# Patient Record
Sex: Male | Born: 1965 | Race: White | Hispanic: No | Marital: Single | Smoking: Former smoker
Health system: Southern US, Community
[De-identification: ages and names within clinical notes are randomized; demographics above are authoritative.]

---

## 2003-11-02 ENCOUNTER — Emergency Department (HOSPITAL_COMMUNITY): Admission: EM | Admit: 2003-11-02 | Discharge: 2003-11-02 | Payer: Self-pay | Admitting: Family Medicine

## 2015-08-26 ENCOUNTER — Emergency Department (HOSPITAL_BASED_OUTPATIENT_CLINIC_OR_DEPARTMENT_OTHER): Payer: Commercial Managed Care - HMO

## 2015-08-26 ENCOUNTER — Emergency Department (HOSPITAL_BASED_OUTPATIENT_CLINIC_OR_DEPARTMENT_OTHER)
Admission: EM | Admit: 2015-08-26 | Discharge: 2015-08-26 | Disposition: A | Discharge status: Home / Self Care | Payer: Commercial Managed Care - HMO | Attending: Emergency Medicine | Admitting: Emergency Medicine

## 2015-08-26 ENCOUNTER — Encounter (HOSPITAL_BASED_OUTPATIENT_CLINIC_OR_DEPARTMENT_OTHER): Payer: Self-pay | Admitting: *Deleted

## 2015-08-26 DIAGNOSIS — W270XXA Contact with workbench tool, initial encounter: Secondary | ICD-10-CM | POA: Insufficient documentation

## 2015-08-26 DIAGNOSIS — Y9389 Activity, other specified: Secondary | ICD-10-CM | POA: Diagnosis not present

## 2015-08-26 DIAGNOSIS — F17200 Nicotine dependence, unspecified, uncomplicated: Secondary | ICD-10-CM | POA: Insufficient documentation

## 2015-08-26 DIAGNOSIS — S62639B Displaced fracture of distal phalanx of unspecified finger, initial encounter for open fracture: Secondary | ICD-10-CM

## 2015-08-26 DIAGNOSIS — S4992XA Unspecified injury of left shoulder and upper arm, initial encounter: Secondary | ICD-10-CM | POA: Diagnosis present

## 2015-08-26 DIAGNOSIS — Y998 Other external cause status: Secondary | ICD-10-CM | POA: Insufficient documentation

## 2015-08-26 DIAGNOSIS — S62661B Nondisplaced fracture of distal phalanx of left index finger, initial encounter for open fracture: Secondary | ICD-10-CM | POA: Insufficient documentation

## 2015-08-26 DIAGNOSIS — Y9289 Other specified places as the place of occurrence of the external cause: Secondary | ICD-10-CM | POA: Diagnosis not present

## 2015-08-26 MED ORDER — TETANUS-DIPHTHERIA TOXOIDS TD 5-2 LFU IM INJ
0.5000 mL | INJECTION | Freq: Once | INTRAMUSCULAR | Status: AC
  Administered 2015-08-26: 0.5 mL via INTRAMUSCULAR
  Filled 2015-08-26: qty 0.5

## 2015-08-26 MED ORDER — HYDROCODONE-ACETAMINOPHEN 5-325 MG PO TABS
1.0000 | ORAL_TABLET | Freq: Once | ORAL | Status: AC
  Administered 2015-08-26: 1 via ORAL
  Filled 2015-08-26: qty 1

## 2015-08-26 MED ORDER — CEPHALEXIN 500 MG PO CAPS: 500.0000 mg | ORAL_CAPSULE | Freq: Three times a day (TID) | ORAL | Status: AC

## 2015-08-26 MED ORDER — HYDROCODONE-ACETAMINOPHEN 5-325 MG PO TABS: 2.0000 | ORAL_TABLET | ORAL | Status: AC | PRN

## 2015-08-26 MED ORDER — LIDOCAINE HCL 2 % IJ SOLN
20.0000 mL | Freq: Once | INTRAMUSCULAR | Status: AC
  Administered 2015-08-26: 400 mg
  Filled 2015-08-26: qty 20

## 2015-08-26 MED ORDER — CEPHALEXIN 250 MG PO CAPS
500.0000 mg | ORAL_CAPSULE | Freq: Once | ORAL | Status: AC
  Administered 2015-08-26: 500 mg via ORAL
  Filled 2015-08-26: qty 2

## 2015-08-26 NOTE — ED Notes (Signed)
He was catching a hammer and the side of the hammer split the tip of his left index finger. Bleeding controlled.

## 2015-08-26 NOTE — Discharge Instructions (Signed)
Wash gently daily with soap and water.  Apply antibiotic ointment and bandage.  Keep splint on to protect tip.  Suture removal 7-10 days   Finger Fracture Fractures of fingers are breaks in the bones of the fingers. There are many types of fractures. There are different ways of treating these fractures. Your health care provider will discuss the best way to treat your fracture. CAUSES Traumatic injury is the main cause of broken fingers. These include:  Injuries while playing sports.  Workplace injuries.  Falls. RISK FACTORS Activities that can increase your risk of finger fractures include:  Sports.  Workplace activities that involve machinery.  A condition called osteoporosis, which can make your bones less dense and cause them to fracture more easily. SIGNS AND SYMPTOMS The main symptoms of a broken finger are pain and swelling within 15 minutes after the injury. Other symptoms include:  Bruising of your finger.  Stiffness of your finger.  Numbness of your finger.  Exposed bones (compound fracture) if the fracture is severe. DIAGNOSIS  The best way to diagnose a broken bone is with X-ray imaging. Additionally, your health care provider will use this X-ray image to evaluate the position of the broken finger bones.  TREATMENT  Finger fractures can be treated with:   Nonreduction--This means the bones are in place. The finger is splinted without changing the positions of the bone pieces. The splint is usually left on for about a week to 10 days. This will depend on your fracture and what your health care provider thinks.  Closed reduction--The bones are put back into position without using surgery. The finger is then splinted.  Open reduction and internal fixation--The fracture site is opened. Then the bone pieces are fixed into place with pins or some type of hardware. This is seldom required. It depends on the severity of the fracture. HOME CARE INSTRUCTIONS   Follow  your health care provider's instructions regarding activities, exercises, and physical therapy.  Only take over-the-counter or prescription medicines for pain, discomfort, or fever as directed by your health care provider. SEEK MEDICAL CARE IF: You have pain or swelling that limits the motion or use of your fingers. SEEK IMMEDIATE MEDICAL CARE IF:  Your finger becomes numb. MAKE SURE YOU:   Understand these instructions.  Will watch your condition.  Will get help right away if you are not doing well or get worse.   This information is not intended to replace advice given to you by your health care provider. Make sure you discuss any questions you have with your health care provider.   Document Released: 10/01/2000 Document Revised: 04/09/2013 Document Reviewed: 01/29/2013 Elsevier Interactive Patient Education [employer]

## 2015-08-26 NOTE — ED Provider Notes (Signed)
CSN: [id number]     Arrival date & time 08/26/15  2006 History  By signing my name below, I, Octavia Heir, attest that this documentation has been prepared under the direction and in the presence of Rolland Porter, MD. Electronically Signed: Octavia Heir, ED Scribe. 08/26/2015. 8:41 PM.      Chief Complaint  Patient presents with  . Finger Injury      The history is provided by the patient. No language interpreter was used.   HPI Comments: Eric Dyer is a 50 y.o. male who presents to the Emergency Department complaining of a constant, gradual worsening left index finger injury onset this evening. Pt was catching a hammer when the side of the hammer caught his finger and split the tip of his finger. Bleeding is currently controlled. He is not up to date on his tetanus shot.   History reviewed. No pertinent past medical history. History reviewed. No pertinent past surgical history. No family history on file. Social History  Substance Use Topics  . Smoking status: Current Every Day Smoker  . Smokeless tobacco: Current User    Types: Snuff  . Alcohol Use: Yes     Comment: daily    Review of Systems  Constitutional: Negative for fever, chills, diaphoresis, appetite change and fatigue.  HENT: Negative for mouth sores, sore throat and trouble swallowing.   Eyes: Negative for visual disturbance.  Respiratory: Negative for cough, chest tightness, shortness of breath and wheezing.   Cardiovascular: Negative for chest pain.  Gastrointestinal: Negative for nausea, vomiting, abdominal pain, diarrhea and abdominal distention.  Endocrine: Negative for polydipsia, polyphagia and polyuria.  Genitourinary: Negative for dysuria, frequency and hematuria.  Musculoskeletal: Negative for gait problem.  Skin: Negative for color change, pallor and rash.  Neurological: Negative for dizziness, syncope, light-headedness and headaches.  Hematological: Does not bruise/bleed easily.  Psychiatric/Behavioral:  Negative for behavioral problems and confusion.      Allergies  Review of patient's allergies indicates no known allergies.  Home Medications   Prior to Admission medications   Medication Sig Start Date End Date Taking? Authorizing Provider  cephALEXin (KEFLEX) 500 MG capsule Take 1 capsule (500 mg total) by mouth 3 (three) times daily. 08/26/15   Rolland Porter, MD  HYDROcodone-acetaminophen (NORCO/VICODIN) 5-325 MG tablet Take 2 tablets by mouth every 4 (four) hours as needed. 08/26/15   Rolland Porter, MD   Triage vitals: BP 132/86 mmHg  Pulse 84  Temp(Src) 98.5 F (36.9 C) (Oral)  Resp 20  Ht  (1.727 m)  Wt 168 lb (76.204 kg)  BMI 25.55 kg/m2  SpO2 97% Physical Exam  Constitutional: He is oriented to person, place, and time. He appears well-developed and well-nourished. No distress.  HENT:  Head: Normocephalic.  Eyes: Conjunctivae are normal. Pupils are equal, round, and reactive to light. No scleral icterus.  Neck: Normal range of motion. Neck supple. No thyromegaly present.  Cardiovascular: Normal rate and regular rhythm.  Exam reveals no gallop and no friction rub.   No murmur heard. Pulmonary/Chest: Effort normal and breath sounds normal. No respiratory distress. He has no wheezes. He has no rales.  Abdominal: Soft. Bowel sounds are normal. He exhibits no distension. There is no tenderness. There is no rebound.  Musculoskeletal: Normal range of motion.  2 cm laceration to the volar tip that extends the the nail bed  Neurological: He is alert and oriented to person, place, and time.  Skin: Skin is warm and dry. No rash noted.  Psychiatric: He  has a normal mood and affect. His behavior is normal.  Nursing note and vitals reviewed.   ED Course  Procedures  DIAGNOSTIC STUDIES: Oxygen Saturation is 97% on RA, normal by my interpretation.  COORDINATION OF CARE:  8:41 PM Discussed treatment plan with pt at bedside and pt agreed to plan.  Labs Review Labs Reviewed - No  data to display  Imaging Review Dg Finger Index Left  08/26/2015  CLINICAL DATA:  Lt index finger hit with hammer x tonight. Pt has pain and laceration at DIP joint to distal tip. Finger wrapped in gauze prior to exam do to bleeding. No old injury known. EXAM: LEFT INDEX FINGER 2+V COMPARISON:  None. FINDINGS: There is comminuted fracture of the distal tuft of the left index finger. No significant fracture displacement. There is overlying soft tissue injury. No radiopaque foreign body. No other fractures.  Joints are normally aligned. IMPRESSION: Nondisplaced comminuted fracture of the distal tuft of the left index finger. Electronically Signed   By: Amie Portland M.D.   On: 08/26/2015 20:47   I have personally reviewed and evaluated these images and lab results as part of my medical decision-making.   EKG Interpretation None      MDM   Final diagnoses:  Open fracture of tuft of distal phalanx of finger, initial encounter    Procedure: Laceration repair and debridement of open fracture. --X-ray show tuft fracture. Laceration extends under the nail bed. Finger blocked with lidocaine 2% plain. Good analgesia. Scribe of Betadine. Finger tourniquet applied. The area of the nail overlying the laceration was resected. Matrix was left intact. The nail bed was gated. There was a free-floating piece of cancellous bone that was debrided from within the wound. The nailbed was then approximated with 2 simple interrupted 5-0 Vicryl sutures. The skin was approximated with 4 simple interrupted 4-0 black nylon salt sutures. Placed in nonadherent Vaseline gauze, tube gauze, and AlumaFoam splint. Given tetanus, Vicodin, Keflex. He has a primary care physician [place]. Ask him to be rechecked in 7-10 days for suture removal.   I personally performed the services described in this documentation, which was scribed in my presence. The recorded information has been reviewed and is accurate.   Rolland Porter,  MD 08/26/15 2141

## 2017-11-30 IMAGING — DX DG FINGER INDEX 2+V*L*
3 series · 3 of 3 positions shown · non-contrast
Comparison: None.

CLINICAL DATA: Lt index finger hit with hammer x tonight. Pt has
pain and laceration at DIP joint to distal tip. Finger wrapped in
gauze prior to exam do to bleeding. No old injury known.

EXAM:
LEFT INDEX FINGER 2+V

[finger ap]
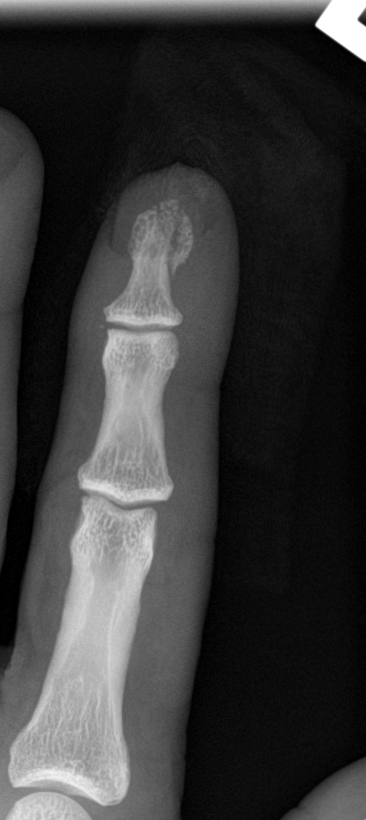

[finger obl]
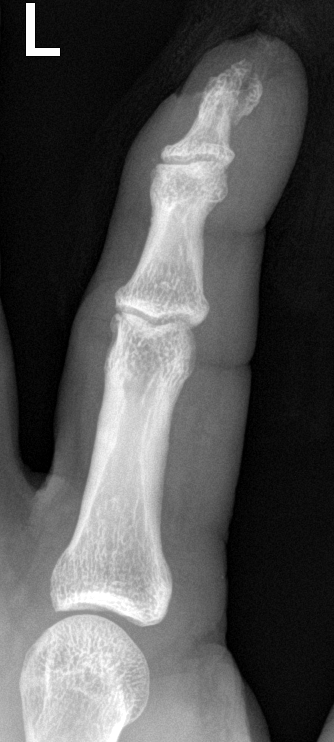

[finger lat]
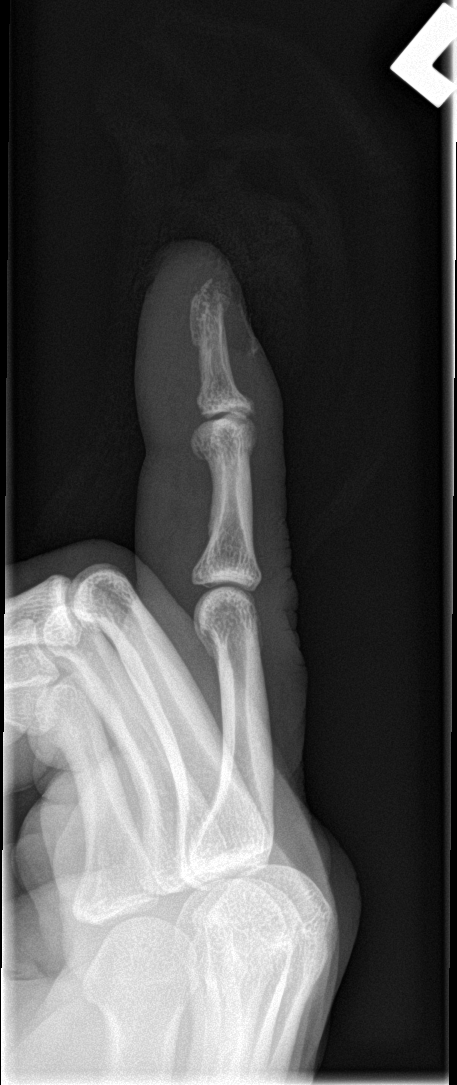

[3 of 3 positions shown; findings below may reference images not displayed]

FINDINGS: There is comminuted fracture of the distal tuft of the left index
finger. No significant fracture displacement. There is overlying
soft tissue injury. No radiopaque foreign body.

No other fractures.  Joints are normally aligned.
IMPRESSION: Nondisplaced comminuted fracture of the distal tuft of the left
index finger.

## 2021-07-23 ENCOUNTER — Emergency Department (HOSPITAL_BASED_OUTPATIENT_CLINIC_OR_DEPARTMENT_OTHER)
Admission: EM | Admit: 2021-07-23 | Discharge: 2021-07-23 | Disposition: A | Discharge status: Home / Self Care | Payer: BC Managed Care – PPO | Attending: Emergency Medicine | Admitting: Emergency Medicine

## 2021-07-23 ENCOUNTER — Encounter (HOSPITAL_BASED_OUTPATIENT_CLINIC_OR_DEPARTMENT_OTHER): Payer: Self-pay

## 2021-07-23 ENCOUNTER — Other Ambulatory Visit: Payer: Self-pay

## 2021-07-23 DIAGNOSIS — L03113 Cellulitis of right upper limb: Secondary | ICD-10-CM | POA: Diagnosis not present

## 2021-07-23 DIAGNOSIS — M79641 Pain in right hand: Secondary | ICD-10-CM | POA: Diagnosis present

## 2021-07-23 MED ORDER — HYDROCODONE-ACETAMINOPHEN 5-325 MG PO TABS
1.0000 | ORAL_TABLET | Freq: Once | ORAL | Status: AC
  Administered 2021-07-23: 1 via ORAL
  Filled 2021-07-23: qty 1

## 2021-07-23 MED ORDER — CEPHALEXIN 250 MG PO CAPS
500.0000 mg | ORAL_CAPSULE | Freq: Once | ORAL | Status: AC
  Administered 2021-07-23: 500 mg via ORAL
  Filled 2021-07-23: qty 2

## 2021-07-23 MED ORDER — HYDROCODONE-ACETAMINOPHEN 5-325 MG PO TABS: 1.0000 | ORAL_TABLET | Freq: Four times a day (QID) | ORAL | 0 refills | Status: AC | PRN

## 2021-07-23 MED ORDER — CEPHALEXIN 500 MG PO CAPS: 500.0000 mg | ORAL_CAPSULE | Freq: Three times a day (TID) | ORAL | 0 refills | Status: AC

## 2021-07-23 NOTE — ED Provider Notes (Signed)
[facility] Provider Note   CSN: [id number] Arrival date & time: 07/23/21  1746     History  Chief Complaint  Patient presents with   Hand Problem    Eric Dyer is a 56 y.o. male.  Patient is a 56 yo male presenting for hand pain. Pt states three days ago he developed right hand pain between the thumb and index finger. States today region began red and more painful. Denies recent injury or puncture wound. Denies purulent drainage.   The history is provided by the patient. No language interpreter was used.      Home Medications Prior to Admission medications   Medication Sig Start Date End Date Taking? Authorizing Provider  cephALEXin (KEFLEX) 500 MG capsule Take 1 capsule (500 mg total) by mouth 3 (three) times daily. 08/26/15   Tanna Furry, MD  HYDROcodone-acetaminophen (NORCO/VICODIN) 5-325 MG tablet Take 2 tablets by mouth every 4 (four) hours as needed. 08/26/15   Tanna Furry, MD      Allergies    Patient has no known allergies.    Review of Systems   Review of Systems  Constitutional:  Negative for chills and fever.  HENT:  Negative for ear pain and sore throat.   Eyes:  Negative for pain and visual disturbance.  Respiratory:  Negative for cough and shortness of breath.   Cardiovascular:  Negative for chest pain and palpitations.  Gastrointestinal:  Negative for abdominal pain and vomiting.  Genitourinary:  Negative for dysuria and hematuria.  Musculoskeletal:  Negative for arthralgias and back pain.  Skin:  Positive for rash. Negative for color change.  Neurological:  Negative for seizures and syncope.  All other systems reviewed and are negative.  Physical Exam Updated Vital Signs BP 127/77    Pulse 78    Temp 97.9 F (36.6 C) (Oral)    Resp 17    Ht 5\' 8"  (1.727 m)    Wt 77.1 kg    SpO2 99%    BMI 25.85 kg/m  Physical Exam Vitals and nursing note reviewed.  Constitutional:      General: He is not in acute distress.     Appearance: He is well-developed.  HENT:     Head: Normocephalic and atraumatic.  Eyes:     Conjunctiva/sclera: Conjunctivae normal.  Cardiovascular:     Rate and Rhythm: Normal rate and regular rhythm.     Pulses:          Radial pulses are 2+ on the right side and 2+ on the left side.     Heart sounds: No murmur heard. Pulmonary:     Effort: Pulmonary effort is normal. No respiratory distress.     Breath sounds: Normal breath sounds.  Musculoskeletal:        General: No swelling.     Cervical back: Neck supple.  Skin:    General: Skin is warm and dry.     Capillary Refill: Capillary refill takes less than 2 seconds.     Findings: Rash present.       Neurological:     Mental Status: He is alert.     GCS: GCS eye subscore is 4. GCS verbal subscore is 5. GCS motor subscore is 6.     Sensory: Sensation is intact.     Motor: Motor function is intact.    ED Results / Procedures / Treatments   Labs (all labs ordered are listed, but only abnormal results are displayed) Labs Reviewed -  No data to display  EKG None  Radiology No results found.  Procedures Procedures    Medications Ordered in ED Medications - No data to display  ED Course/ Medical Decision Making/ A&P                           Medical Decision Making Risk Prescription drug management.   8:06 PM  Erythematous, painful rash with swelling between thumb and first digit on right hand. Rash is not circumferential around any digits at this time. No tenosynovitis at this time. Rash demarcated with marker. Keflex given for cellulitis with norco for pain control.  Patient in no distress and overall condition improved here in the ED. Recommendations for prompt return to ED if cellulitis becomes circumferential around any digits or for signs/symptoms of tenosynovitis. Medications sent to pharmacy. Detailed discussions were had with the patient regarding current findings, and need for close f/u with PCP or on  call doctor. The patient has been instructed to return immediately if the symptoms worsen in any way for re-evaluation. Patient verbalized understanding and is in agreement with current care plan. All questions answered prior to discharge.         Final Clinical Impression(s) / ED Diagnoses Final diagnoses:  Cellulitis of right upper extremity    Rx / DC Orders ED Discharge Orders     None         Lianne Cure, DO [id number]

## 2021-07-23 NOTE — ED Triage Notes (Signed)
Pt reports he noticed a small, sore bump in between his right thumb and and index finger about 3 days ago. Pt states the area has become reddened, warm, and swollen since his first noticed it.

## 2021-07-23 NOTE — ED Notes (Signed)
D/c paperwork reviewed with pt, including prescription and f/u care. Pt verbalized understanding, no questions or concerns at time of d/c. Pt ambulatory to ED exit.
